# Patient Record
Sex: Female | Born: 1950 | Race: White | Hispanic: No | Smoking: Never smoker
Health system: Southern US, Community
[De-identification: ages and names within clinical notes are randomized; demographics above are authoritative.]

## PROBLEM LIST (undated history)

## (undated) DIAGNOSIS — G43909 Migraine, unspecified, not intractable, without status migrainosus: Secondary | ICD-10-CM

## (undated) HISTORY — PX: ABDOMINAL HYSTERECTOMY: SHX81

## (undated) HISTORY — PX: CYST REMOVAL NECK: SHX6281

## (undated) HISTORY — PX: TONSILLECTOMY: SUR1361

## (undated) HISTORY — PX: EYE SURGERY: SHX253

---

## 2004-01-07 ENCOUNTER — Other Ambulatory Visit: Admission: RE | Admit: 2004-01-07 | Discharge: 2004-01-07 | Payer: Self-pay | Admitting: Obstetrics and Gynecology

## 2008-05-26 ENCOUNTER — Emergency Department (HOSPITAL_BASED_OUTPATIENT_CLINIC_OR_DEPARTMENT_OTHER): Admission: EM | Admit: 2008-05-26 | Discharge: 2008-05-26 | Payer: Self-pay | Admitting: Emergency Medicine

## 2010-06-29 LAB — GLUCOSE, CAPILLARY: Glucose-Capillary: 105 mg/dL — ABNORMAL HIGH (ref 70–99)

## 2011-07-09 ENCOUNTER — Other Ambulatory Visit: Payer: Self-pay | Admitting: Nurse Practitioner

## 2011-07-09 ENCOUNTER — Other Ambulatory Visit: Payer: Self-pay | Admitting: Obstetrics and Gynecology

## 2011-07-09 DIAGNOSIS — N63 Unspecified lump in unspecified breast: Secondary | ICD-10-CM

## 2011-07-12 ENCOUNTER — Other Ambulatory Visit: Payer: Self-pay

## 2011-07-16 ENCOUNTER — Ambulatory Visit
Admission: RE | Admit: 2011-07-16 | Discharge: 2011-07-16 | Disposition: A | Discharge status: Home / Self Care | Payer: PRIVATE HEALTH INSURANCE | Source: Ambulatory Visit | Attending: Obstetrics and Gynecology | Admitting: Obstetrics and Gynecology

## 2011-07-16 ENCOUNTER — Other Ambulatory Visit: Payer: Self-pay | Admitting: Obstetrics and Gynecology

## 2011-07-16 DIAGNOSIS — N63 Unspecified lump in unspecified breast: Secondary | ICD-10-CM

## 2013-07-02 ENCOUNTER — Encounter (HOSPITAL_COMMUNITY): Payer: Self-pay

## 2013-07-02 ENCOUNTER — Encounter (HOSPITAL_COMMUNITY)
Admission: RE | Admit: 2013-07-02 | Discharge: 2013-07-02 | Disposition: A | Discharge status: Home / Self Care | Payer: PRIVATE HEALTH INSURANCE | Source: Ambulatory Visit | Attending: Orthopedic Surgery | Admitting: Orthopedic Surgery

## 2013-07-02 ENCOUNTER — Ambulatory Visit (HOSPITAL_COMMUNITY)
Admission: RE | Admit: 2013-07-02 | Discharge: 2013-07-02 | Disposition: A | Discharge status: Home / Self Care | Payer: PRIVATE HEALTH INSURANCE | Source: Ambulatory Visit | Attending: Orthopedic Surgery | Admitting: Orthopedic Surgery

## 2013-07-02 DIAGNOSIS — Z01812 Encounter for preprocedural laboratory examination: Secondary | ICD-10-CM | POA: Insufficient documentation

## 2013-07-02 HISTORY — DX: Migraine, unspecified, not intractable, without status migrainosus: G43.909

## 2013-07-02 LAB — CBC
HEMATOCRIT: 38.2 % (ref 36.0–46.0)
HEMOGLOBIN: 12.8 g/dL (ref 12.0–15.0)
MCH: 28.6 pg (ref 26.0–34.0)
MCHC: 33.5 g/dL (ref 30.0–36.0)
MCV: 85.3 fL (ref 78.0–100.0)
Platelets: 233 10*3/uL (ref 150–400)
RBC: 4.48 MIL/uL (ref 3.87–5.11)
RDW: 13.5 % (ref 11.5–15.5)
WBC: 6.8 10*3/uL (ref 4.0–10.5)

## 2013-07-02 LAB — BASIC METABOLIC PANEL
BUN: 12 mg/dL (ref 6–23)
CHLORIDE: 100 meq/L (ref 96–112)
CO2: 29 meq/L (ref 19–32)
Calcium: 9.3 mg/dL (ref 8.4–10.5)
Creatinine, Ser: 0.68 mg/dL (ref 0.50–1.10)
GFR calc non Af Amer: >90 mL/min (ref >90)
Glucose, Bld: 92 mg/dL (ref 70–99)
POTASSIUM: 3.9 meq/L (ref 3.7–5.3)
Sodium: 142 mEq/L (ref 137–147)

## 2013-07-02 LAB — SURGICAL PCR SCREEN
MRSA, PCR: NEGATIVE
Staphylococcus aureus: POSITIVE — AB

## 2013-07-02 LAB — TYPE AND SCREEN
ABO/RH(D): O POS
ANTIBODY SCREEN: NEGATIVE

## 2013-07-02 LAB — ABO/RH: ABO/RH(D): O POS

## 2013-07-02 NOTE — Progress Notes (Signed)
Primary physician =- dr. Dareen PianoAnderson [place] No cardiologist No prior cardiac testing

## 2013-07-02 NOTE — Progress Notes (Signed)
Left pt message on voicemail of positive PCR of Staph. Called in rx for Mupirocin 2% ointment to [facility] on [address] [place].

## 2013-07-02 NOTE — Pre-Procedure Instructions (Addendum)
Christy ShelterCarol M GEXBMWUXLosendale  07/02/2013   Your procedure is scheduled on:  Thursday, April 23rd  Report to Admitting at 0530 AM.  Call this number if you have problems the morning of surgery: [phone]   Remember:   Do not eat food or drink liquids after midnight.   Take these medicines the morning of surgery with A SIP OF WATER: none   Do not wear jewelry, make-up or nail polish.  Do not wear lotions, powders, or perfumes. You may wear deodorant.  Do not shave 48 hours prior to surgery. Men may shave face and neck.  Do not bring valuables to the hospital.  [facility] is not responsible for any belongings or valuables.               Contacts, dentures or bridgework may not be worn into surgery.  Leave suitcase in the car. After surgery it may be brought to your room.  For patients admitted to the hospital, discharge time is determined by your treatment team.               Patients discharged the day of surgery will not be allowed to drive home.  Please read over the following fact sheets that you were given: Pain Booklet, Coughing and Deep Breathing, Blood Transfusion Information, MRSA Information and Surgical Site Infection Prevention [place] - Preparing for Surgery  Before surgery, you can play an important role.  Because skin is not sterile, your skin needs to be as free of germs as possible.  You can reduce the number of germs on you skin by washing with CHG (chlorahexidine gluconate) soap before surgery.  CHG is an antiseptic cleaner which kills germs and bonds with the skin to continue killing germs even after washing.  Please DO NOT use if you have an allergy to CHG or antibacterial soaps.  If your skin becomes reddened/irritated stop using the CHG and inform your nurse when you arrive at Short Stay.  Do not shave (including legs and underarms) for at least 48 hours prior to the first CHG shower.  You may shave your face.  Please follow these instructions carefully:   1.  Shower  with CHG Soap the night before surgery and the morning of Surgery.  2.  If you choose to wash your hair, wash your hair first as usual with your normal shampoo.  3.  After you shampoo, rinse your hair and body thoroughly to remove the shampoo.  4.  Use CHG as you would any other liquid soap.  You can apply CHG directly to the skin and wash gently with scrungie or a clean washcloth.  5.  Apply the CHG Soap to your body ONLY FROM THE NECK DOWN.  Do not use on open wounds or open sores.  Avoid contact with your eyes, ears, mouth and genitals (private parts).  Wash genitals (private parts) with your normal soap.  6.  Wash thoroughly, paying special attention to the area where your surgery will be performed.  7.  Thoroughly rinse your body with warm water from the neck down.  8.  DO NOT shower/wash with your normal soap after using and rinsing off the CHG Soap.  9.  Pat yourself dry with a clean towel.            10.  Wear clean pajamas.            11.  Place clean sheets on your bed the night of your first shower and  do not sleep with pets.  Day of Surgery  Do not apply any lotions/deoderants the morning of surgery.  Please wear clean clothes to the hospital/surgery center.

## 2013-07-08 MED ORDER — DEXAMETHASONE SODIUM PHOSPHATE 4 MG/ML IJ SOLN
4.0000 mg | Freq: Once | INTRAMUSCULAR | Status: AC
  Administered 2013-07-09: 10 mg via INTRAVENOUS
  Filled 2013-07-08: qty 1

## 2013-07-08 MED ORDER — CEFAZOLIN SODIUM-DEXTROSE 2-3 GM-% IV SOLR
2.0000 g | INTRAVENOUS | Status: AC
  Administered 2013-07-09 (×2): 2 g via INTRAVENOUS
  Filled 2013-07-08: qty 50

## 2013-07-08 MED ORDER — ACETAMINOPHEN 10 MG/ML IV SOLN
1000.0000 mg | Freq: Four times a day (QID) | INTRAVENOUS | Status: DC
  Administered 2013-07-09: 1000 mg via INTRAVENOUS
  Filled 2013-07-08: qty 100

## 2013-07-09 ENCOUNTER — Inpatient Hospital Stay (HOSPITAL_COMMUNITY): Payer: PRIVATE HEALTH INSURANCE

## 2013-07-09 ENCOUNTER — Inpatient Hospital Stay (HOSPITAL_COMMUNITY)
Admission: RE | Admit: 2013-07-09 | Discharge: 2013-07-11 | DRG: 460 | Disposition: A | Discharge status: Home Health | Payer: PRIVATE HEALTH INSURANCE | Source: Ambulatory Visit | Attending: Orthopedic Surgery | Admitting: Orthopedic Surgery

## 2013-07-09 ENCOUNTER — Encounter (HOSPITAL_COMMUNITY): Payer: Self-pay | Admitting: *Deleted

## 2013-07-09 ENCOUNTER — Encounter (HOSPITAL_COMMUNITY)
Admission: RE | Disposition: A | Discharge status: Home Health | Payer: Self-pay | Source: Ambulatory Visit | Attending: Orthopedic Surgery

## 2013-07-09 ENCOUNTER — Encounter (HOSPITAL_COMMUNITY): Payer: PRIVATE HEALTH INSURANCE | Admitting: Anesthesiology

## 2013-07-09 ENCOUNTER — Inpatient Hospital Stay (HOSPITAL_COMMUNITY): Payer: PRIVATE HEALTH INSURANCE | Admitting: Anesthesiology

## 2013-07-09 DIAGNOSIS — M549 Dorsalgia, unspecified: Secondary | ICD-10-CM | POA: Diagnosis present

## 2013-07-09 DIAGNOSIS — M48061 Spinal stenosis, lumbar region without neurogenic claudication: Principal | ICD-10-CM | POA: Diagnosis present

## 2013-07-09 DIAGNOSIS — Z981 Arthrodesis status: Secondary | ICD-10-CM

## 2013-07-09 DIAGNOSIS — Z833 Family history of diabetes mellitus: Secondary | ICD-10-CM

## 2013-07-09 DIAGNOSIS — Q762 Congenital spondylolisthesis: Secondary | ICD-10-CM

## 2013-07-09 SURGERY — POSTERIOR LUMBAR FUSION 1 LEVEL
Anesthesia: General | Site: Spine Lumbar

## 2013-07-09 MED ORDER — METHOCARBAMOL 500 MG PO TABS
500.0000 mg | ORAL_TABLET | Freq: Four times a day (QID) | ORAL | Status: DC | PRN
  Administered 2013-07-09: 500 mg via ORAL

## 2013-07-09 MED ORDER — METHOCARBAMOL 500 MG PO TABS
ORAL_TABLET | ORAL | Status: AC
  Filled 2013-07-09: qty 1

## 2013-07-09 MED ORDER — SODIUM CHLORIDE 0.9 % IJ SOLN
INTRAMUSCULAR | Status: AC
  Filled 2013-07-09: qty 10

## 2013-07-09 MED ORDER — DIPHENHYDRAMINE HCL 50 MG/ML IJ SOLN: 12.5000 mg | Freq: Four times a day (QID) | INTRAMUSCULAR | Status: DC | PRN

## 2013-07-09 MED ORDER — MIDAZOLAM HCL 5 MG/5ML IJ SOLN
INTRAMUSCULAR | Status: DC | PRN
  Administered 2013-07-09: 2 mg via INTRAVENOUS

## 2013-07-09 MED ORDER — MIDAZOLAM HCL 2 MG/2ML IJ SOLN
INTRAMUSCULAR | Status: AC
  Filled 2013-07-09: qty 2

## 2013-07-09 MED ORDER — ACETAMINOPHEN 10 MG/ML IV SOLN
1000.0000 mg | Freq: Four times a day (QID) | INTRAVENOUS | Status: AC
  Administered 2013-07-09 – 2013-07-10 (×4): 1000 mg via INTRAVENOUS
  Filled 2013-07-09 (×4): qty 100

## 2013-07-09 MED ORDER — OXYCODONE HCL 5 MG PO TABS
5.0000 mg | ORAL_TABLET | Freq: Once | ORAL | Status: AC | PRN
  Administered 2013-07-09: 5 mg via ORAL

## 2013-07-09 MED ORDER — OXYCODONE HCL 5 MG PO TABS
ORAL_TABLET | ORAL | Status: AC
  Filled 2013-07-09: qty 1

## 2013-07-09 MED ORDER — OXYCODONE HCL 5 MG PO TABS
10.0000 mg | ORAL_TABLET | ORAL | Status: DC | PRN
  Administered 2013-07-11: 10 mg via ORAL
  Filled 2013-07-09: qty 2

## 2013-07-09 MED ORDER — ROCURONIUM BROMIDE 50 MG/5ML IV SOLN
INTRAVENOUS | Status: AC
  Filled 2013-07-09: qty 1

## 2013-07-09 MED ORDER — DEXAMETHASONE 4 MG PO TABS
4.0000 mg | ORAL_TABLET | Freq: Four times a day (QID) | ORAL | Status: DC
  Administered 2013-07-09 – 2013-07-11 (×7): 4 mg via ORAL
  Filled 2013-07-09 (×11): qty 1

## 2013-07-09 MED ORDER — MENTHOL 3 MG MT LOZG: 1.0000 | LOZENGE | OROMUCOSAL | Status: DC | PRN

## 2013-07-09 MED ORDER — PROPOFOL 10 MG/ML IV BOLUS
INTRAVENOUS | Status: AC
  Filled 2013-07-09: qty 20

## 2013-07-09 MED ORDER — ONDANSETRON HCL 4 MG/2ML IJ SOLN: 4.0000 mg | Freq: Four times a day (QID) | INTRAMUSCULAR | Status: DC | PRN

## 2013-07-09 MED ORDER — DEXAMETHASONE SODIUM PHOSPHATE 10 MG/ML IJ SOLN
INTRAMUSCULAR | Status: AC
  Filled 2013-07-09: qty 1

## 2013-07-09 MED ORDER — EPHEDRINE SULFATE 50 MG/ML IJ SOLN
INTRAMUSCULAR | Status: DC | PRN
  Administered 2013-07-09 (×2): 5 mg via INTRAVENOUS

## 2013-07-09 MED ORDER — ALBUMIN HUMAN 5 % IV SOLN
INTRAVENOUS | Status: DC | PRN
  Administered 2013-07-09: 11:00:00 via INTRAVENOUS

## 2013-07-09 MED ORDER — FENTANYL CITRATE 0.05 MG/ML IJ SOLN
INTRAMUSCULAR | Status: DC | PRN
  Administered 2013-07-09: 250 ug via INTRAVENOUS
  Administered 2013-07-09 (×2): 50 ug via INTRAVENOUS

## 2013-07-09 MED ORDER — LIDOCAINE HCL (CARDIAC) 20 MG/ML IV SOLN
INTRAVENOUS | Status: DC | PRN
  Administered 2013-07-09: 30 mg via INTRAVENOUS

## 2013-07-09 MED ORDER — ONDANSETRON HCL 4 MG/2ML IJ SOLN
INTRAMUSCULAR | Status: AC
  Filled 2013-07-09: qty 2

## 2013-07-09 MED ORDER — PROPOFOL 10 MG/ML IV BOLUS
INTRAVENOUS | Status: DC | PRN
  Administered 2013-07-09: 100 mg via INTRAVENOUS

## 2013-07-09 MED ORDER — CEFAZOLIN SODIUM 1-5 GM-% IV SOLN
1.0000 g | Freq: Three times a day (TID) | INTRAVENOUS | Status: AC
  Administered 2013-07-09 – 2013-07-10 (×2): 1 g via INTRAVENOUS
  Filled 2013-07-09 (×3): qty 50

## 2013-07-09 MED ORDER — ONDANSETRON HCL 4 MG/2ML IJ SOLN
4.0000 mg | INTRAMUSCULAR | Status: DC | PRN
  Administered 2013-07-09: 4 mg via INTRAVENOUS
  Filled 2013-07-09: qty 2

## 2013-07-09 MED ORDER — SODIUM CHLORIDE 0.9 % IJ SOLN: 9.0000 mL | INTRAMUSCULAR | Status: DC | PRN

## 2013-07-09 MED ORDER — LACTATED RINGERS IV SOLN
INTRAVENOUS | Status: DC | PRN
  Administered 2013-07-09 (×3): via INTRAVENOUS

## 2013-07-09 MED ORDER — CEFAZOLIN SODIUM-DEXTROSE 2-3 GM-% IV SOLR
INTRAVENOUS | Status: AC
  Filled 2013-07-09: qty 50

## 2013-07-09 MED ORDER — OXYCODONE HCL 5 MG/5ML PO SOLN: 5.0000 mg | Freq: Once | ORAL | Status: AC | PRN

## 2013-07-09 MED ORDER — PHENOL 1.4 % MT LIQD: 1.0000 | OROMUCOSAL | Status: DC | PRN

## 2013-07-09 MED ORDER — MORPHINE SULFATE (PF) 1 MG/ML IV SOLN
INTRAVENOUS | Status: DC
  Administered 2013-07-09: 2 mg via INTRAVENOUS
  Administered 2013-07-09: 13:00:00 via INTRAVENOUS
  Administered 2013-07-10: 1 mg via INTRAVENOUS
  Administered 2013-07-10: 9 mg via INTRAVENOUS
  Administered 2013-07-10: 2 mg via INTRAVENOUS

## 2013-07-09 MED ORDER — SODIUM CHLORIDE 0.9 % IJ SOLN
3.0000 mL | Freq: Two times a day (BID) | INTRAMUSCULAR | Status: DC
  Administered 2013-07-10 (×2): 3 mL via INTRAVENOUS

## 2013-07-09 MED ORDER — BUPIVACAINE-EPINEPHRINE (PF) 0.25% -1:200000 IJ SOLN
INTRAMUSCULAR | Status: AC
  Filled 2013-07-09: qty 30

## 2013-07-09 MED ORDER — HYDROMORPHONE HCL PF 1 MG/ML IJ SOLN
INTRAMUSCULAR | Status: AC
  Filled 2013-07-09: qty 1

## 2013-07-09 MED ORDER — PHENYLEPHRINE HCL 10 MG/ML IJ SOLN
INTRAMUSCULAR | Status: DC | PRN
  Administered 2013-07-09 (×4): 40 ug via INTRAVENOUS

## 2013-07-09 MED ORDER — NALOXONE HCL 0.4 MG/ML IJ SOLN: 0.4000 mg | INTRAMUSCULAR | Status: DC | PRN

## 2013-07-09 MED ORDER — SODIUM CHLORIDE 0.9 % IV SOLN: 250.0000 mL | INTRAVENOUS | Status: DC

## 2013-07-09 MED ORDER — LACTATED RINGERS IV SOLN
INTRAVENOUS | Status: DC
  Administered 2013-07-09 (×2): via INTRAVENOUS

## 2013-07-09 MED ORDER — THROMBIN 20000 UNITS EX SOLR
CUTANEOUS | Status: DC | PRN
  Administered 2013-07-09: 09:00:00 via TOPICAL

## 2013-07-09 MED ORDER — BUPIVACAINE-EPINEPHRINE 0.25% -1:200000 IJ SOLN
INTRAMUSCULAR | Status: DC | PRN
  Administered 2013-07-09: 10 mL

## 2013-07-09 MED ORDER — SODIUM CHLORIDE 0.9 % IJ SOLN: 3.0000 mL | INTRAMUSCULAR | Status: DC | PRN

## 2013-07-09 MED ORDER — HYDROMORPHONE HCL PF 1 MG/ML IJ SOLN
0.2500 mg | INTRAMUSCULAR | Status: DC | PRN
  Administered 2013-07-09: 0.5 mg via INTRAVENOUS
  Administered 2013-07-09 (×2): 0.25 mg via INTRAVENOUS
  Administered 2013-07-09: 0.5 mg via INTRAVENOUS

## 2013-07-09 MED ORDER — ONDANSETRON HCL 4 MG/2ML IJ SOLN
INTRAMUSCULAR | Status: DC | PRN
  Administered 2013-07-09: 4 mg via INTRAVENOUS

## 2013-07-09 MED ORDER — THROMBIN 20000 UNITS EX SOLR
CUTANEOUS | Status: AC
  Filled 2013-07-09: qty 20000

## 2013-07-09 MED ORDER — 0.9 % SODIUM CHLORIDE (POUR BTL) OPTIME
TOPICAL | Status: DC | PRN
  Administered 2013-07-09: 1000 mL

## 2013-07-09 MED ORDER — DEXAMETHASONE SODIUM PHOSPHATE 4 MG/ML IJ SOLN
4.0000 mg | Freq: Four times a day (QID) | INTRAMUSCULAR | Status: DC
  Administered 2013-07-09: 4 mg via INTRAVENOUS
  Filled 2013-07-09 (×11): qty 1

## 2013-07-09 MED ORDER — SUCCINYLCHOLINE CHLORIDE 20 MG/ML IJ SOLN
INTRAMUSCULAR | Status: DC | PRN
  Administered 2013-07-09: 100 mg via INTRAVENOUS

## 2013-07-09 MED ORDER — EPHEDRINE SULFATE 50 MG/ML IJ SOLN
INTRAMUSCULAR | Status: AC
  Filled 2013-07-09: qty 1

## 2013-07-09 MED ORDER — ARTIFICIAL TEARS OP OINT
TOPICAL_OINTMENT | OPHTHALMIC | Status: DC | PRN
  Administered 2013-07-09: 1 via OPHTHALMIC

## 2013-07-09 MED ORDER — LIDOCAINE HCL (CARDIAC) 20 MG/ML IV SOLN
INTRAVENOUS | Status: AC
  Filled 2013-07-09: qty 5

## 2013-07-09 MED ORDER — DIPHENHYDRAMINE HCL 12.5 MG/5ML PO ELIX: 12.5000 mg | ORAL_SOLUTION | Freq: Four times a day (QID) | ORAL | Status: DC | PRN

## 2013-07-09 MED ORDER — FENTANYL CITRATE 0.05 MG/ML IJ SOLN
INTRAMUSCULAR | Status: AC
  Filled 2013-07-09: qty 5

## 2013-07-09 MED ORDER — MORPHINE SULFATE (PF) 1 MG/ML IV SOLN
INTRAVENOUS | Status: AC
  Filled 2013-07-09: qty 25

## 2013-07-09 MED ORDER — HEMOSTATIC AGENTS (NO CHARGE) OPTIME
TOPICAL | Status: DC | PRN
  Administered 2013-07-09: 1 via TOPICAL

## 2013-07-09 MED ORDER — KETOTIFEN FUMARATE 0.025 % OP SOLN
1.0000 [drp] | Freq: Two times a day (BID) | OPHTHALMIC | Status: DC | PRN
  Filled 2013-07-09: qty 5

## 2013-07-09 MED ORDER — METHOCARBAMOL 100 MG/ML IJ SOLN
500.0000 mg | Freq: Four times a day (QID) | INTRAVENOUS | Status: DC | PRN
  Filled 2013-07-09: qty 5

## 2013-07-09 MED ORDER — ARTIFICIAL TEARS OP OINT
TOPICAL_OINTMENT | OPHTHALMIC | Status: AC
  Filled 2013-07-09: qty 3.5

## 2013-07-09 SURGICAL SUPPLY — 77 items
BLADE SURG ROTATE 9660 (MISCELLANEOUS) IMPLANT
BUR EGG ELITE 4.0 (BURR) ×2 IMPLANT
BUR EGG ELITE 4.0MM (BURR) ×1
CLIP NEUROVISION LG (CLIP) ×3 IMPLANT
CLOSURE STERI-STRIP 1/2X4 (GAUZE/BANDAGES/DRESSINGS) ×1
CLSR STERI-STRIP ANTIMIC 1/2X4 (GAUZE/BANDAGES/DRESSINGS) ×2 IMPLANT
CONT SPEC 4OZ CLIKSEAL STRL BL (MISCELLANEOUS) ×3 IMPLANT
CORDS BIPOLAR (ELECTRODE) ×3 IMPLANT
COVER MAYO STAND STRL (DRAPES) ×6 IMPLANT
COVER SURGICAL LIGHT HANDLE (MISCELLANEOUS) ×3 IMPLANT
DRAIN CHANNEL 15F RND FF W/TCR (WOUND CARE) ×3 IMPLANT
DRAPE C-ARM 42X72 X-RAY (DRAPES) ×3 IMPLANT
DRAPE C-ARMOR (DRAPES) ×3 IMPLANT
DRAPE ORTHO SPLIT 77X108 STRL (DRAPES) ×2
DRAPE POUCH INSTRU U-SHP 10X18 (DRAPES) ×3 IMPLANT
DRAPE SURG 17X23 STRL (DRAPES) ×3 IMPLANT
DRAPE SURG ORHT 6 SPLT 77X108 (DRAPES) ×1 IMPLANT
DRAPE U-SHAPE 47X51 STRL (DRAPES) ×3 IMPLANT
DRSG MEPILEX BORDER 4X4 (GAUZE/BANDAGES/DRESSINGS) ×3 IMPLANT
DRSG MEPILEX BORDER 4X8 (GAUZE/BANDAGES/DRESSINGS) ×3 IMPLANT
DURAPREP 26ML APPLICATOR (WOUND CARE) ×3 IMPLANT
ELECT BLADE 4.0 EZ CLEAN MEGAD (MISCELLANEOUS) ×3
ELECT BLADE 6.5 EXT (BLADE) IMPLANT
ELECT REM PT RETURN 9FT ADLT (ELECTROSURGICAL) ×3
ELECTRODE BLDE 4.0 EZ CLN MEGD (MISCELLANEOUS) ×1 IMPLANT
ELECTRODE REM PT RTRN 9FT ADLT (ELECTROSURGICAL) ×1 IMPLANT
EVACUATOR SILICONE 100CC (DRAIN) ×3 IMPLANT
GLOVE BIOGEL PI IND STRL 8 (GLOVE) ×1 IMPLANT
GLOVE BIOGEL PI IND STRL 8.5 (GLOVE) ×1 IMPLANT
GLOVE BIOGEL PI INDICATOR 8 (GLOVE) ×2
GLOVE BIOGEL PI INDICATOR 8.5 (GLOVE) ×2
GLOVE ECLIPSE 8.5 STRL (GLOVE) ×3 IMPLANT
GLOVE ORTHO TXT STRL SZ7.5 (GLOVE) ×3 IMPLANT
GOWN STRL REUS W/ TWL LRG LVL3 (GOWN DISPOSABLE) ×1 IMPLANT
GOWN STRL REUS W/TWL 2XL LVL3 (GOWN DISPOSABLE) ×6 IMPLANT
GOWN STRL REUS W/TWL LRG LVL3 (GOWN DISPOSABLE) ×2
GUIDEWIRE NITINOL BEVEL TIP (WIRE) ×12 IMPLANT
KIT BASIN OR (CUSTOM PROCEDURE TRAY) ×3 IMPLANT
KIT NEEDLE NVM5 EMG ELECT (KITS) ×1 IMPLANT
KIT NEEDLE NVM5 EMG ELECTRODE (KITS) ×2
KIT POSITION SURG JACKSON T1 (MISCELLANEOUS) ×3 IMPLANT
KIT ROOM TURNOVER OR (KITS) ×3 IMPLANT
LIGHT SOURCE ANGLE TIP STR 7FT (MISCELLANEOUS) ×3 IMPLANT
MAS TLIF HOOP SHIM (KITS) ×3 IMPLANT
NEEDLE 22X1 1/2 (OR ONLY) (NEEDLE) ×3 IMPLANT
NEEDLE SPNL 18GX3.5 QUINCKE PK (NEEDLE) ×6 IMPLANT
NS IRRIG 1000ML POUR BTL (IV SOLUTION) ×3 IMPLANT
PACK LAMINECTOMY ORTHO (CUSTOM PROCEDURE TRAY) ×3 IMPLANT
PACK UNIVERSAL I (CUSTOM PROCEDURE TRAY) ×3 IMPLANT
PAD ARMBOARD 7.5X6 YLW CONV (MISCELLANEOUS) ×6 IMPLANT
PATTIES SURGICAL .5 X.5 (GAUZE/BANDAGES/DRESSINGS) IMPLANT
PATTIES SURGICAL .5 X1 (DISPOSABLE) ×3 IMPLANT
PRECEPT SHANK CANN 6.5X40 (Neuro Prosthesis/Implant) ×12 IMPLANT
PRECEPT TULIPS (Neuro Prosthesis/Implant) ×12 IMPLANT
PROBE BALL TIP NVM5 SNG USE (BALLOONS) ×3 IMPLANT
ROD 50MM (Rod) ×6 IMPLANT
SCREW PRECEPT SET (Screw) ×12 IMPLANT
SHEET CONFORM 45LX20WX7H (Bone Implant) ×3 IMPLANT
SPONGE GAUZE 4X4 12PLY STER LF (GAUZE/BANDAGES/DRESSINGS) ×3 IMPLANT
SPONGE LAP 4X18 X RAY DECT (DISPOSABLE) ×6 IMPLANT
SPONGE SURGIFOAM ABS GEL 100 (HEMOSTASIS) ×3 IMPLANT
SPONGE SURGIFOAM ABS GEL SZ50 (HEMOSTASIS) ×3 IMPLANT
SURGIFLO TRUKIT (HEMOSTASIS) ×3 IMPLANT
SUT ETHILON 2 0 FS 18 (SUTURE) ×3 IMPLANT
SUT MON AB 3-0 SH 27 (SUTURE) ×4
SUT MON AB 3-0 SH27 (SUTURE) ×2 IMPLANT
SUT VIC AB 1 CTX 18 (SUTURE) ×6 IMPLANT
SUT VIC AB 2-0 CT1 18 (SUTURE) ×3 IMPLANT
SYR BULB IRRIGATION 50ML (SYRINGE) ×3 IMPLANT
SYR CONTROL 10ML LL (SYRINGE) ×6 IMPLANT
TAPE CLOTH SURG 4X10 WHT LF (GAUZE/BANDAGES/DRESSINGS) ×3 IMPLANT
TOWEL OR 17X24 6PK STRL BLUE (TOWEL DISPOSABLE) IMPLANT
TOWEL OR 17X26 10 PK STRL BLUE (TOWEL DISPOSABLE) ×6 IMPLANT
TRAY FOLEY CATH 14FR (SET/KITS/TRAYS/PACK) ×3 IMPLANT
TRAY FOLEY CATH 16FRSI W/METER (SET/KITS/TRAYS/PACK) IMPLANT
WATER STERILE IRR 1000ML POUR (IV SOLUTION) IMPLANT
YANKAUER SUCT BULB TIP NO VENT (SUCTIONS) ×3 IMPLANT

## 2013-07-09 NOTE — Brief Op Note (Signed)
07/09/2013  11:19 AM  PATIENT:  [name]  63 y.o. female  PRE-OPERATIVE DIAGNOSIS:  GRADE 3 SLIP L5-S1  POST-OPERATIVE DIAGNOSIS:  GRADE 3 SLIP L5-S1  PROCEDURE:  Procedure(s): POSTERIOR DECOMPRESSION L5-S1 POSTERIOR FUSION L5-S1 POSSIBLE L4-S1 AND POSSIBLE L4-5 INTERBODY FUSION (N/A)  SURGEON:  Surgeon(s) and Role:    * Venita Lickahari Isayah Ignasiak, MD - Primary  PHYSICIAN ASSISTANT:   ASSISTANTS: Zonia KiefJames Owens   ANESTHESIA:   general  EBL:  Total I/O In: 1750 [I.V.:1500; IV Piggyback:250] Out: 700 [Urine:500; Blood:200]  BLOOD ADMINISTERED:none  DRAINS: JP in the back  LOCAL MEDICATIONS USED:  MARCAINE     SPECIMEN:  No Specimen  DISPOSITION OF SPECIMEN:  N/A  COUNTS:  YES  TOURNIQUET:  * No tourniquets in log *  DICTATION: .Other Dictation: Dictation Number [id number]  PLAN OF CARE: Admit to inpatient   PATIENT DISPOSITION:  PACU - hemodynamically stable.

## 2013-07-09 NOTE — Anesthesia Procedure Notes (Signed)
Procedure Name: Intubation Date/Time: 07/09/2013 8:26 AM Performed by: Lovie CholOCK, Latiya Navia K Pre-anesthesia Checklist: Patient identified, Emergency Drugs available, Suction available, Patient being monitored and Timeout performed Patient Re-evaluated:Patient Re-evaluated prior to inductionOxygen Delivery Method: Circle system utilized Preoxygenation: Pre-oxygenation with 100% oxygen Intubation Type: IV induction Ventilation: Mask ventilation without difficulty and Oral airway inserted - appropriate to patient size Laryngoscope Size: Miller and 2 Grade View: Grade II Tube type: Oral Tube size: 7.0 mm Number of attempts: 1 Airway Equipment and Method: Stylet Placement Confirmation: ETT inserted through vocal cords under direct vision,  positive ETCO2,  CO2 detector and breath sounds checked- equal and bilateral Secured at: 21 cm Tube secured with: Tape Dental Injury: Teeth and Oropharynx as per pre-operative assessment

## 2013-07-09 NOTE — Progress Notes (Signed)
Utilization review completed.  

## 2013-07-09 NOTE — Transfer of Care (Signed)
Immediate Anesthesia Transfer of Care Note  Patient: [name]  Procedure(s) Performed: Procedure(s): POSTERIOR DECOMPRESSION L4-S1 POSTERIOR FUSION L4-S1 WITH CROSSLINK (N/A)  Patient Location: PACU  Anesthesia Type:General  Level of Consciousness: awake, oriented and patient cooperative  Airway & Oxygen Therapy: Patient Spontanous Breathing and Patient connected to nasal cannula oxygen  Post-op Assessment: Report given to PACU RN and Post -op Vital signs reviewed and stable  Post vital signs: Reviewed  Complications: No apparent anesthesia complications

## 2013-07-09 NOTE — Anesthesia Postprocedure Evaluation (Signed)
  Anesthesia Post-op Note  Patient: Christy ShelterCarol M Abdulla  Procedure(s) Performed: Procedure(s): POSTERIOR DECOMPRESSION L4-S1 POSTERIOR FUSION L4-S1 WITH CROSSLINK (N/A)  Patient Location: PACU  Anesthesia Type:General  Level of Consciousness: awake and alert   Airway and Oxygen Therapy: Patient Spontanous Breathing  Post-op Pain: mild  Post-op Assessment: Post-op Vital signs reviewed, Patient's Cardiovascular Status Stable and Respiratory Function Stable  Post-op Vital Signs: Reviewed  Filed Vitals:   07/09/13 1315  BP: 113/62  Pulse: 70  Temp: 36.4 C  Resp: 13    Complications: No apparent anesthesia complications

## 2013-07-09 NOTE — Anesthesia Preprocedure Evaluation (Addendum)
Anesthesia Evaluation  Patient identified by MRN, date of birth, ID band Patient awake    Reviewed: Allergy & Precautions, H&P , NPO status , Patient's Chart, lab work & pertinent test results  History of Anesthesia Complications Negative for: history of anesthetic complications  Airway Mallampati: II TM Distance: >3 FB Neck ROM: Full    Dental no notable dental hx. (+) Teeth Intact, Dental Advisory Given   Pulmonary neg pulmonary ROS,  breath sounds clear to auscultation  Pulmonary exam normal       Cardiovascular negative cardio ROS  Rhythm:Regular Rate:Normal     Neuro/Psych  Headaches, negative psych ROS   GI/Hepatic negative GI ROS, Neg liver ROS,   Endo/Other  negative endocrine ROS  Renal/GU negative Renal ROS  negative genitourinary   Musculoskeletal   Abdominal   Peds  Hematology negative hematology ROS (+)   Anesthesia Other Findings   Reproductive/Obstetrics negative OB ROS                         Anesthesia Physical Anesthesia Plan  ASA: II  Anesthesia Plan: General   Post-op Pain Management:    Induction: Intravenous  Airway Management Planned: Oral ETT  Additional Equipment:   Intra-op Plan:   Post-operative Plan: Extubation in OR  Informed Consent: I have reviewed the patients History and Physical, chart, labs and discussed the procedure including the risks, benefits and alternatives for the proposed anesthesia with the patient or authorized representative who has indicated his/her understanding and acceptance.   Dental advisory given  Plan Discussed with: CRNA  Anesthesia Plan Comments:         Anesthesia Quick Evaluation

## 2013-07-09 NOTE — H&P (Signed)
History of Present Illness  The patient is a 63 year old female who comes in today for a preoperative History and Physical. The patient is scheduled for a Posterior decompression L5-S1, Posterior fusion L5-S1, possible L4-5 to be performed by Dr. Debria Garretahari D. Shon BatonBrooks, MD at [facility] 07/09/2013 . Please see the hospital record for complete dictated history and physical.   Back painis described as the following: The patient reports low back (Left side, ) symptoms including pain, burning and tingling (left leg) without any known injury. The patient describes the pain as burning and throbbing. The patient describes the severity of their symptoms as severe. The patient feels as if the symptoms are worsening. Symptoms are relieved by cold packs, heat packs and nonsteroidal anti-inflammatory drugs. Current treatment includes non-opioid analgesics (OTC cream and Ibuprofen). Prior to being seen today the patient was previously evaluated in this clinic. Past treatment has included epidural injections (through Dr.Elsner, ), chiropractic manipulation and Class 4 Laser treatments by Genene ChurnAaron Williams , Chiropractor which did not help.   63 year old white female complains of low back pain and bilateral leg pain. Grade 3 spondylolisthesis at L5-S1 and minimal disc bulge at L4-L5. Returns. She states symptoms are unchanged from previous visit. She wants to proceed with posterior decompression at L5-S1 with instrumented fusion either L4 to S1 or L5 to S1.     Allergies No Known Drug Allergies. 05/15/2013    Family History(Sue M Toomes; 06/30/2013 2:01 PM) Cancer. father and brother Diabetes Mellitus. grandmother mothers side    Social History(Sue M Toomes; 06/30/2013 2:01 PM) Alcohol use. current drinker; drinks wine; only occasionally per week Children. 3 Current work status. working full time Drug/Alcohol Rehab (Currently). no Drug/Alcohol Rehab (Previously).  no Exercise. Exercises weekly; does running / walking Illicit drug use. no Living situation. live alone Marital status. divorced Number of flights of stairs before winded. 2-3 Pain Contract. no Tobacco / smoke exposure. no Tobacco use. never smoker     Medication History No Current Medications. Medications Reconciled.   On clinical exam she is a pleasant woman who appears younger than her stated age. She is alert and oriented times three. No shortness of breath or chest pain. The abdomen is soft and nontender. Compartments are soft and nontender. Intact peripheral pulses bilaterally in the lower extremities. She has no hip, knee or ankle pain with joint range of motion. She has back pain with palpation. Horrific pain with extension and forward flexion. She is grossly neurologically intact with no significant motor deficits. She does have significant numbness and dysesthesias. No incontinence of bowel and bladder.  RADIOGRAPHS:  X rays of the spine taken today, multiple views AP, lateral, flexion and extension show that she does have hyperplastic L5 pedicle. She has lordosis still well maintained. No scoliosis. Significant foraminal compromise. She has bilateral pars defects at L5.  She had an MRI 05/22/13, which demonstrates a grade III spondylolisthesis at L5-S1 with complete collapse of the L5-S1 disc and severe degenerative changes, severe bilateral neuroforaminal narrowing, lateral recess stenosis, and central stenosis. There is significant encroachment. At 4-5 there is minimal disc bulge. Minimal stenosis. No significant findings above.  At this point in time clinically the patient has progressive debilitating back, buttock, and leg pain. She has had appropriate conservative management and yet, unfortunately, she continues to have severe debilitating pain. In order to address this issue she would need an extensive decompression followed by  instrumented fusion to prevent worsening of her slip.  The concern is given the slip angle and the hyperplastic L5 pedicle that fixation at the L5 level may be problematic. Therefore, I would need to extend the instrumentation to L4. If I did extend it to L4 in addition to the pedicle screw fixation, I would proceed with an interbody device. This can be accomplished with TLIF. Given that, we have talked about a posterior decompression L5-S1 with instrumented fusion either L4 to S1 or L5 to S1 with interbody fixation at L4-5.    We have addressed the surgery, we have reviewed the risks and benefits. The risks of that include infection, bleeding, nerve damage, death, stoke, paralysis, failure to heal, need for further surgery, ongoing or worse pain, loss of bowel and bladder control, throat pain, CSF leak, nonunion meaning it does not fuse, blood clots, adjacent segment disease.    Due to the potential of a multilevel procedure, I would request an external bone stimulator.

## 2013-07-10 ENCOUNTER — Inpatient Hospital Stay (HOSPITAL_COMMUNITY): Payer: PRIVATE HEALTH INSURANCE

## 2013-07-10 MED ORDER — ONDANSETRON 4 MG PO TBDP: 4.0000 mg | ORAL_TABLET | Freq: Three times a day (TID) | ORAL | Status: AC | PRN

## 2013-07-10 MED ORDER — ACETAMINOPHEN 500 MG PO TABS
1000.0000 mg | ORAL_TABLET | Freq: Four times a day (QID) | ORAL | Status: DC | PRN
  Administered 2013-07-10 – 2013-07-11 (×3): 1000 mg via ORAL
  Filled 2013-07-10 (×3): qty 2

## 2013-07-10 MED ORDER — METHOCARBAMOL 500 MG PO TABS: 500.0000 mg | ORAL_TABLET | Freq: Four times a day (QID) | ORAL | Status: AC | PRN

## 2013-07-10 MED ORDER — DOCUSATE SODIUM 100 MG PO CAPS: 100.0000 mg | ORAL_CAPSULE | Freq: Two times a day (BID) | ORAL | Status: AC

## 2013-07-10 MED ORDER — OXYCODONE-ACETAMINOPHEN 10-325 MG PO TABS: 1.0000 | ORAL_TABLET | Freq: Four times a day (QID) | ORAL | Status: AC | PRN

## 2013-07-10 MED ORDER — POLYETHYLENE GLYCOL 3350 17 G PO PACK: 17.0000 g | PACK | Freq: Every day | ORAL | Status: AC

## 2013-07-10 MED FILL — Heparin Sodium (Porcine) Inj 1000 Unit/ML: INTRAMUSCULAR | Qty: 30 | Status: AC

## 2013-07-10 MED FILL — Sodium Chloride IV Soln 0.9%: INTRAVENOUS | Qty: 1000 | Status: AC

## 2013-07-10 MED FILL — Sodium Chloride Irrigation Soln 0.9%: Qty: 3000 | Status: AC

## 2013-07-10 NOTE — Plan of Care (Signed)
Problem: Consults Goal: Diagnosis - Spinal Surgery Thoraco/Lumbar Spine Fusion--see op note

## 2013-07-10 NOTE — Care Management Note (Signed)
CARE MANAGEMENT NOTE 07/10/2013  Patient:  Syble CreekROSENDALE,Jakki M   Account Number:  [id number]  Date Initiated:  07/10/2013  Documentation initiated by:  Vance PeperBRADY,Sani Madariaga  Subjective/Objective Assessment:   63 yr old female s/pL5-S1 posterior decompression fusion.     Action/Plan:   Case manager spoke with patient and family concerning home health and DME needs at discharge. Choice offered. [facility], [facility]. Patient is 5'3" will need youth walker.   Anticipated DC Date:  07/11/2013   Anticipated DC Plan:  HOME W HOME HEALTH SERVICES      DC Planning Services  CM consult      [facility]  HOME HEALTH  DURABLE MEDICAL EQUIPMENT   Choice offered to / List presented to:  C-1 Patient   DME arranged  [facility]      DME agency  [facility]     HH arranged  HH-2 PT      [facility]   Status of service:  Completed, signed off Medicare Important Message given?   (If response is "NO", the following Medicare IM given date fields will be blank) Date Medicare IM given:   Date Additional Medicare IM given:    Discharge Disposition:  HOME W HOME HEALTH SERVICES

## 2013-07-10 NOTE — Progress Notes (Signed)
    Subjective: Procedure(s) (LRB): POSTERIOR DECOMPRESSION L4-S1 POSTERIOR FUSION L4-S1 WITH CROSSLINK (N/A) 1 Day Post-Op  Patient reports pain as 2 on 0-10 scale.  Reports decreased leg pain reports incisional back pain   Positive void Negative bowel movement Positive flatus Negative chest pain or shortness of breath  Objective: Vital signs in last 24 hours: Temp:  [97.5 F (36.4 C)-98.1 F (36.7 C)] 97.9 F (36.6 C) (04/24 0456) Pulse Rate:  [67-79] 69 (04/24 0456) Resp:  [12-26] 16 (04/24 0456) BP: (99-120)/(55-90) 103/55 mmHg (04/24 0456) SpO2:  [93 %-100 %] 97 % (04/24 0456)  Intake/Output from previous day: 04/23 0701 - 04/24 0700 In: 5086.4 [P.O.:960; I.V.:3476.4; IV Piggyback:650] Out: 2595 [Urine:2200; Drains:195; Blood:200]  Labs: No results found for this basename: WBC, RBC, HCT, PLT,  in the last 72 hours No results found for this basename: NA, K, CL, CO2, BUN, CREATININE, GLUCOSE, CALCIUM,  in the last 72 hours No results found for this basename: LABPT, INR,  in the last 72 hours  Physical Exam: Neurologically intact ABD soft Neurovascular intact Intact pulses distally Incision: dressing C/D/I Compartment soft 60  - drain output   Assessment/Plan: Patient stable  xrays pending Continue mobilization with physical therapy Continue care  Advance diet Up with therapy D/C IV fluids D/c drain as there is low output Mobilization today Possible d/c Saturday/Sunday   Christy Lickahari Primo Innis, MD [facility] [phone]

## 2013-07-10 NOTE — Evaluation (Signed)
Physical Therapy Evaluation Patient Details Name: Christy CreekCarol M Washer MRN: [id number] DOB: March 11, 1951 Today's Date: 07/10/2013   History of Present Illness  pt presents with L4-S1 PLIF.    Clinical Impression  Pt very motivated and moving well.  Will need to f/u and see if pt will need RW for D/C or amb without UE support.  Will continue to follow.      Follow Up Recommendations Home health PT;Supervision - Intermittent    Equipment Recommendations  Rolling walker with 5" wheels    Recommendations for Other Services       Precautions / Restrictions Precautions Precautions: Back Precaution Booklet Issued: Yes (comment) Required Braces or Orthoses: Spinal Brace Spinal Brace: Lumbar corset;Applied in sitting position Restrictions Weight Bearing Restrictions: No      Mobility  Bed Mobility Overal bed mobility: Needs Assistance Bed Mobility: Rolling;Sidelying to Sit Rolling: Supervision Sidelying to sit: Min guard       General bed mobility comments: cues for log roll technique and encouragement.    Transfers Overall transfer level: Needs assistance Equipment used: None Transfers: Sit to/from Stand Sit to Stand: Min guard         General transfer comment: cues for UE use.    Ambulation/Gait Ambulation/Gait assistance: Min guard Ambulation Distance (Feet): 200 Feet Assistive device:  (IV pole) Gait Pattern/deviations: Step-through pattern;Decreased stride length   Gait velocity interpretation: Below normal speed for age/gender General Gait Details: pt only utilized IV pole for support, but will need to assess next session if pt will need RW or be able to go without UE support.    Stairs            Wheelchair Mobility    Modified Rankin (Stroke Patients Only)       Balance                                             Pertinent Vitals/Pain 4-5/10.  Premedicated.      Home Living Family/patient expects to be discharged to::  Private residence Living Arrangements: Children Available Help at Discharge: Family;Available 24 hours/day Type of Home: House Home Access:  (Only 2" threshold)     Home Layout: One level Home Equipment: None Additional Comments: pt's sister to come stay with her for 2 weeks.      Prior Function Level of Independence: Independent               Hand Dominance        Extremity/Trunk Assessment   Upper Extremity Assessment: Defer to OT evaluation           Lower Extremity Assessment: Overall WFL for tasks assessed      Cervical / Trunk Assessment: Normal  Communication   Communication: No difficulties  Cognition Arousal/Alertness: Awake/alert Behavior During Therapy: WFL for tasks assessed/performed Overall Cognitive Status: Within Functional Limits for tasks assessed                      General Comments      Exercises        Assessment/Plan    PT Assessment Patient needs continued PT services  PT Diagnosis Difficulty walking   PT Problem List Decreased strength;Decreased activity tolerance;Decreased balance;Decreased mobility;Decreased coordination;Decreased knowledge of use of DME;Decreased knowledge of precautions;Pain  PT Treatment Interventions DME instruction;Gait training;Stair training;Functional mobility training;Therapeutic activities;Therapeutic exercise;Balance training;Neuromuscular re-education;Patient/family education  PT Goals (Current goals can be found in the Care Plan section) Acute Rehab PT Goals Patient Stated Goal: Walk without pain.   PT Goal Formulation: With patient Time For Goal Achievement: 07/17/13 Potential to Achieve Goals: Good    Frequency Min 5X/week   Barriers to discharge        Co-evaluation               End of Session Equipment Utilized During Treatment: Gait belt;Back brace Activity Tolerance: Patient tolerated treatment well Patient left: in chair;with call bell/phone within reach Nurse  Communication: Mobility status         Time: 5409-81190753-0818 PT Time Calculation (min): 25 min   Charges:   PT Evaluation $Initial PT Evaluation Tier I: 1 Procedure PT Treatments $Gait Training: 8-22 mins $Therapeutic Activity: 8-22 mins   PT G CodesSunny Schlein:          Helder Crisafulli F Ayen Viviano, [place] CarolinaPT [phone] 07/10/2013, 12:05 PM

## 2013-07-10 NOTE — Evaluation (Signed)
Occupational Therapy Evaluation and Discharge Patient Details Name: Syble CreekCarol M Cullen MRN: [id number] DOB: Jul 25, 1950 Today's Date: 07/10/2013    History of Present Illness pt presents with L4-S1 PLIF.     Clinical Impression   This 63 yo female independent pta admitted for above presents to acute OT with all education completed, we will D/C from acute OT.    Follow Up Recommendations  No OT follow up    Equipment Recommendations  None recommended by OT       Precautions / Restrictions Precautions Precautions: Back Precaution Booklet Issued: Yes (comment) Required Braces or Orthoses: Spinal Brace Spinal Brace: Applied in sitting position;Lumbar corset Restrictions Weight Bearing Restrictions: No      Mobility Bed Mobility Overal bed mobility: Needs Assistance Bed Mobility: Rolling;Sidelying to Sit Rolling: Supervision (for technique) Sidelying to sit: Min guard       General bed mobility comments: cues for log roll technique and encouragement.    Transfers Overall transfer level: Needs assistance Equipment used: Rolling walker (2 wheeled) Transfers: Sit to/from Stand Sit to Stand: Supervision         General transfer comment: VCs for safe hand placement         ADL                                         General ADL Comments: Pt is S for all BADLs including shower stall and toilet transfer.                Pertinent Vitals/Pain 2/10, back, no intervention needed     Hand Dominance Right   Extremity/Trunk Assessment Upper Extremity Assessment Upper Extremity Assessment: Overall WFL for tasks assessed   Lower Extremity Assessment Lower Extremity Assessment: Overall WFL for tasks assessed   Cervical / Trunk Assessment Cervical / Trunk Assessment: Normal   Communication Communication Communication: No difficulties   Cognition Arousal/Alertness: Awake/alert Behavior During Therapy: WFL for tasks assessed/performed Overall  Cognitive Status: Within Functional Limits for tasks assessed                                Home Living Family/patient expects to be discharged to:: Private residence Living Arrangements: Children Available Help at Discharge: Family;Available 24 hours/day Type of Home: House Home Access:  (Only 2" threshold)     Home Layout: One level     Bathroom Shower/Tub: Walk-in shower;Door Shower/tub characteristics: Sport and exercise psychologistDoor Bathroom Toilet: Standard     Home Equipment: Shower seat - built in   Additional Comments: pt's sister to come stay with her for 2 weeks.        Prior Functioning/Environment Level of Independence: Independent                      OT Goals(Current goals can be found in the care plan section) Acute Rehab OT Goals Patient Stated Goal: home tomorrow  OT Frequency:                End of Session Equipment Utilized During Treatment: Back brace;Rolling walker  Activity Tolerance: Patient tolerated treatment well Patient left: in chair;with call bell/phone within reach;with family/visitor present   Time: 4098-11911305-1333 OT Time Calculation (min): 28 min Charges:  OT General Charges $OT Visit: 1 Procedure OT Evaluation $Initial OT Evaluation Tier I: 1 Procedure OT Treatments $Self Care/Home  Management : 8-22 mins  Evette GeorgesCatherine Eva Ulah Olmo [phone] 07/10/2013, 2:37 PM

## 2013-07-10 NOTE — Op Note (Signed)
Christy Frieze:  [name], [name]             ACCOUNT NO.:  [id number]  MEDICAL RECORD NO.:  [id number]  LOCATION:                                 FACILITY:  PHYSICIAN:  Alvy Bealahari D Shanette Tamargo, MD    DATE OF BIRTH:  12/23/1950  DATE OF PROCEDURE:  07/09/2013 DATE OF DISCHARGE:  07/09/2013                              OPERATIVE REPORT   PREOPERATIVE DIAGNOSES:  Grade 3 spondylolisthesis L5-S1.  POSTOPERATIVE DIAGNOSIS:  Grade 3 spondylolisthesis L5-S1.  OPERATIVE PROCEDURE:  Gill decompression L5-S1, posterolateral decompression L4-5, posterolateral segmental instrumentation L4-S1, and posterolateral arthrodesis L4-S1.  COMPLICATIONS:  None.  CONDITION:  Stable.  HISTORY:  This is a very pleasant 63 year old woman who has been having severe progressive back, buttock, and left leg pain, now increasing on the right.  Imaging studies demonstrated a structural abnormality. Despite physical therapy, injection therapy, medications, and activity modification, she continued to deteriorate.  As a result of the failure of conservative management, we elected to proceed with surgery.  Because of the slip, I was concerned that I could not get pedicle screws properly positioned at L5 and S1, and so I consented her for L4-S1 fusion.  Because of the tall L4-5 disk, I was concerned that interbody fixation would have a higher likelihood of pseudoarthrosis and possible cage migration, so I elected not to do the interbody fixation at L4-5. L5-S1, I completely compressed down and there was essentially no interbody space to actually reduce.  OPERATIVE NOTE:  The patient was brought to the operating room, placed supine on the operating table.  After successful induction of general anesthesia, endotracheal intubation, TEDs, SCDs, and Foley were inserted.  Intraoperative needles were placed into the lower extremity for intraoperative EMG neuromonitoring.  The patient was turned prone onto the Wilson frame.  All bony  prominences were well padded.  The back was prepped and draped in a standard fashion.  Time-out was taken to confirm the patient, procedure, and all other pertinent important data. Once that was completed, I then made an incision starting at the superior aspect of the L4 vertebral body and proceeding to the inferior aspect of the S1.  Sharp dissection was carried out down to the deep fascia.  The deep fascia was sharply incised and I stripped the paraspinal muscles to expose the L4, L5, and S1 lamina and facet complexes.  There was significant inflamed fibrocartilaginous material at the L5-S1 level, especially started over the L5 pars defect.  I carefully dissected through this and I noted that the entire posterior element at L5 was globally unstable.  Once I had the exposure, I elected to place my pedicle screws.  Using a Jamshidi needle, visualization and fluoro, I placed the Jamshidi needle in the appropriate starting position, and then using fluoro and neural stim, I advanced the Jamshidi needle through this pedicle and into the vertebral body.  I then placed a guidewire through this.  I repeated this procedure at S1 and on the contralateral side.  Once all 4 pedicle screws were placed, I tapped over each cannula and then probed with a ball-tip sealer to ensure I had solid bony canal.  I then placed 40 mm screws, 6.5 diameter  from [facility] into each of the pedicle holes. Once all 4 screws were placed, I proceeded with the decompression. Because of the anatomical abnormality (spondylolisthesis), I could not place L5 pedicle screws as they would interfere with the S1 screw.  For this reason, then I took the fusion up to the L4 level.  I then used a double-action Leksell rongeur to remove the entire spinous process of L5 and majority of that of L4.  I removed the bulk of the posterior elements with a double-action Leksell rongeur, and then used a fine-nerve curette to develop plane  between the ligamentum flavum and the thecal sac.  I completed my central decompression.  I then went inferiorly and took down the superior portion of the S1 lamina.  I then traced them into the lateral recess until I could palpate the S1 pedicle and then went into the foramen to decompress the S1 nerve root.  Woodson elevator, I could feel them palpate the medial border of the S1 pedicle and there was no breach.  I then continued my dissection superiorly. This has been a significant neural compression.  There had been fibrocartilaginous overgrowth, significant thickening of the ligamentum flavum, and thecal sac compression.  Using a Penfield 4, I gently dissected the plane between the thecal sac in the overlying spur.  I removed this with 2 and 3 mm Kerrison.  Once I had the central 5 decompression complete, I then proceeded into the lateral recess.  I carefully dissected down until I could palpate the pedicle and then I identified the L5 nerve root.  I then used 2 mm Kerrison to remove all of the fibrocartilaginous scar that was in the L5 pars defect and completely unroofed the L5 nerve root.  This was done bilaterally.  Once I could clearly visualize the L5 nerve root beyond the lateral border of the third complex and I adequately decompressed it.  I then continued my dissection superiorly performing an inferior hemilaminotomy of L4.  At this point, my decompression has taken up the proximal inferior aspect of the L4 pedicle.  I had adequate decompression, I could palpate superiorly with my Woodson elevator in the lateral recess and inferiorly as well.  Satisfactorily with the decompression, but most importantly with the L5 nerve root decompression, I used the high-speed burr to decorticate the transverse process of L4, lateral aspect of the 4-5 facet, and the remaining portion of the L5-S1 superior facet.  I then placed bone graft in the posterolateral gutter along with an  ACT allograft sheet to maintain its position.  I then applied the polyaxial heads and then placed 50 mm screws to reduce the slip slightly and locked them into place.  Locking nuts were torqued down according to the manufacture standards.  I then placed a crosslink for added stability. Once all of the screws were locked and torqued down, I irrigated copiously with normal saline, may ensure hemostasis using bipolar electrocautery.  I then placed thrombin-soaked Gelfoam patty over the exposed thecal sac and then placed a deep drain.  I then closed in a layered fashion with interrupted #1 Vicryl sutures, 2-0 Vicryl sutures, and a 3-0 Monocryl.  The patient was then extubated and transferred to the PACU without incident.  At the end of the case, all needle and sponge counts were correct.  There was no adverse intraoperative events. First assistant, Zonia KiefJames Owens, PA, he was instrumental in assisting with retraction, visualization, suction, and wound closure.     Alvy Bealahari D Juandedios Dudash,  MD   ______________________________ Alvy Beal, MD    DDB/MEDQ  D:  07/09/2013  T:  07/10/2013  Job:  [id number]

## 2013-07-11 NOTE — Progress Notes (Signed)
Physical Therapy Treatment Patient Details Name: Christy CreekCarol M [name] MRN: [id number] DOB: 1950/04/16 Today's Date: 07/11/2013    History of Present Illness pt presents with L4-S1 PLIF.      PT Comments    Patient able to practice step. Progressing well and requiring only supervision for safety. Able to recall all precautions and implement with mobility. Anticipate DC home today per mobility goals set on eval.   Follow Up Recommendations  Home health PT;Supervision - Intermittent     Equipment Recommendations  Rolling walker with 5" wheels    Recommendations for Other Services       Precautions / Restrictions Precautions Precautions: Back Required Braces or Orthoses: Spinal Brace Spinal Brace: Applied in sitting position;Lumbar corset    Mobility  Bed Mobility Overal bed mobility: Modified Independent                Transfers Overall transfer level: Modified independent                  Ambulation/Gait Ambulation/Gait assistance: Supervision Ambulation Distance (Feet): 300 Feet Assistive device: Rolling walker (2 wheeled) Gait Pattern/deviations: Step-through pattern     General Gait Details: good safety with use of RW. Cued to relax shoulders   Stairs Stairs: Yes Stairs assistance: Supervision   Number of Stairs: 1 General stair comments: Cues for technique  Wheelchair Mobility    Modified Rankin (Stroke Patients Only)       Balance                                    Cognition Arousal/Alertness: Awake/alert Behavior During Therapy: WFL for tasks assessed/performed Overall Cognitive Status: Within Functional Limits for tasks assessed                      Exercises      General Comments        Pertinent Vitals/Pain Denied pain    Home Living                      Prior Function            PT Goals (current goals can now be found in the care plan section) Progress towards PT goals:  Progressing toward goals    Frequency  Min 5X/week    PT Plan Current plan remains appropriate    Co-evaluation             End of Session Equipment Utilized During Treatment: Gait belt;Back brace Activity Tolerance: Patient tolerated treatment well Patient left: with call bell/phone within reach;in bed     Time: [id number] PT Time Calculation (min): 17 min  Charges:  $Gait Training: 8-22 mins                    G Codes:      Adline PotterJulia Elizabeth Reinaldo Helt 07/11/2013, 8:42 AM 07/11/2013 Adline PotterJulia Elizabeth Marvette Schamp PTA [phone] pager [phone] office

## 2013-07-11 NOTE — Progress Notes (Signed)
Orthopedics Progress Note  Subjective: Patient feeling better this AM, ready to go home.  Reports that she is passing gas.  Objective:  Filed Vitals:   07/11/13 0559  BP: 119/62  Pulse: 71  Temp: 98 F (36.7 C)  Resp: 18    General: Awake and alert  Musculoskeletal: lumbar incision CDI, dressing changed, no erythema, no drainage Neurovascularly intact  Lab Results  Component Value Date   WBC 6.8 07/02/2013   HGB 12.8 07/02/2013   HCT 38.2 07/02/2013   MCV 85.3 07/02/2013   PLT 233 07/02/2013       Component Value Date/Time   NA 142 07/02/2013 1514   K 3.9 07/02/2013 1514   CL 100 07/02/2013 1514   CO2 29 07/02/2013 1514   GLUCOSE 92 07/02/2013 1514   BUN 12 07/02/2013 1514   CREATININE 0.68 07/02/2013 1514   CALCIUM 9.3 07/02/2013 1514   GFRNONAA >90 07/02/2013 1514   GFRAA >90 07/02/2013 1514    No results found for this basename: INR, PROTIME    Assessment/Plan: POD #2 s/p Procedure(s): POSTERIOR DECOMPRESSION L4-S1 POSTERIOR FUSION L4-S1 WITH CROSSLINK Patient stable for D/C to home.  Meds on chart. Will her bandages to change every other day at home  CarlyssSteven R. Ranell PatrickNorris, MD 07/11/2013 9:45 AM

## 2013-07-11 NOTE — Discharge Instructions (Signed)
Change the bandage as instructed every other day for a week.  Ok to get the wound wet in the shower in a week.  Use brace while up and the walker.  Avoid lifting pushing or pulling or straining.  Follow up with Dr Shon BatonBrooks in two weeks  [phone]

## 2013-07-11 NOTE — Discharge Summary (Signed)
Physician Discharge Summary   Patient ID: [name] MRN: [id number] DOB/AGE: 11-13-1950 63 y.o.  Admit date: 07/09/2013 Discharge date: 07/11/2013  Admission Diagnoses:  Active Problems:   Back pain   Discharge Diagnoses:  Same   Surgeries: Procedure(s): POSTERIOR DECOMPRESSION L4-S1 POSTERIOR FUSION L4-S1 WITH CROSSLINK on 07/09/2013   Consultants: OT, PT  Discharged Condition: Stable  Hospital Course: [name] is an 63 y.o. female who was admitted 07/09/2013 with a chief complaint of back pain, and found to have a diagnosis of lumbar stenosis.  They were brought to the operating room on 07/09/2013 and underwent the above named procedures.    The patient had an uncomplicated hospital course and was stable for discharge.  Recent vital signs:  Filed Vitals:   07/11/13 0559  BP: 119/62  Pulse: 71  Temp: 98 F (36.7 C)  Resp: 18    Recent laboratory studies:  Results for orders placed during the hospital encounter of 07/02/13  SURGICAL PCR SCREEN      Result Value Ref Range   MRSA, PCR NEGATIVE  NEGATIVE   Staphylococcus aureus POSITIVE (*) NEGATIVE  BASIC METABOLIC PANEL      Result Value Ref Range   Sodium 142  137 - 147 mEq/L   Potassium 3.9  3.7 - 5.3 mEq/L   Chloride 100  96 - 112 mEq/L   CO2 29  19 - 32 mEq/L   Glucose, Bld 92  70 - 99 mg/dL   BUN 12  6 - 23 mg/dL   Creatinine, Ser 7.840.68  0.50 - 1.10 mg/dL   Calcium 9.3  8.4 - 69.610.5 mg/dL   GFR calc non Af Amer >90  >90 mL/min   GFR calc Af Amer >90  >90 mL/min  CBC      Result Value Ref Range   WBC 6.8  4.0 - 10.5 K/uL   RBC 4.48  3.87 - 5.11 MIL/uL   Hemoglobin 12.8  12.0 - 15.0 g/dL   HCT 29.538.2  28.436.0 - 13.246.0 %   MCV 85.3  78.0 - 100.0 fL   MCH 28.6  26.0 - 34.0 pg   MCHC 33.5  30.0 - 36.0 g/dL   RDW 44.013.5  10.211.5 - 72.515.5 %   Platelets 233  150 - 400 K/uL  TYPE AND SCREEN      Result Value Ref Range   ABO/RH(D) O POS     Antibody Screen NEG     Sample Expiration 07/16/2013    ABO/RH        Result Value Ref Range   ABO/RH(D) O POS      Discharge Medications:     Medication List    STOP taking these medications       ibuprofen 200 MG tablet  Commonly known as:  ADVIL,MOTRIN      TAKE these medications       ALAWAY 0.025 % ophthalmic solution  Generic drug:  ketotifen  Place 1 drop into both eyes 2 (two) times daily as needed (allergies).     docusate sodium 100 MG capsule  Commonly known as:  COLACE  Take 1 capsule (100 mg total) by mouth 2 (two) times daily.     IMITREX PO  Take 1 tablet by mouth daily as needed (migraine).     loratadine 10 MG tablet  Commonly known as:  CLARITIN  Take 10 mg by mouth daily.     methocarbamol 500 MG tablet  Commonly known as:  ROBAXIN  Take 1 tablet (500 mg total) by mouth every 6 (six) hours as needed for muscle spasms.     ondansetron 4 MG disintegrating tablet  Commonly known as:  ZOFRAN ODT  Take 1 tablet (4 mg total) by mouth every 8 (eight) hours as needed for nausea or vomiting.     oxyCODONE-acetaminophen 10-325 MG per tablet  Commonly known as:  PERCOCET  Take 1-2 tablets by mouth every 6 (six) hours as needed for pain.     polyethylene glycol packet  Commonly known as:  MIRALAX / GLYCOLAX  Take 17 g by mouth daily.        Diagnostic Studies: Dg Lumbar Spine 2-3 Views  07/10/2013   CLINICAL DATA:  s/p spine fusion  EXAM: LUMBAR SPINE - 2-3 VIEW  COMPARISON:  DG C-ARM GT 120 MIN dated 07/09/2013; DG LUMBAR SPINE 2-3 VIEWS dated 07/02/2013  FINDINGS: Patient is status post posterior fusion L4 through S1. Hardware intact without evidence of loosening or failure. Degenerative disc disease changes and grade 2 anterolisthesis at L5-S1 stable. Bilateral pars defects appreciated at L5. No acute osseous abnormalities.  IMPRESSION: Patient is status post fusion L4 through S1.   Electronically Signed   By: Salome HolmesHector  Cooper M.D.   On: 07/10/2013 08:52   Dg Lumbar Spine 2-3 Views  07/02/2013   CLINICAL DATA:  Lumbar  spondylolisthesis.  EXAM: LUMBAR SPINE - 2-3 VIEW  COMPARISON:  MRI dated 08/23/2009 and radiographs dated 07/26/2009  FINDINGS: There is a grade 2 spondylolisthesis of L5 on S1 with bilateral pars defects. Severe narrowing of the L5-S1 disc space. The rest of the lumbar spine appears normal.  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   Electronically Signed   By: Geanie CooleyJim  Maxwell M.D.   On: 07/02/2013 15:38   Dg Lumbar Spine Complete  07/09/2013   CLINICAL DATA:  L4-S1 and paraspinal fusion  EXAM: DG C-ARM GT 120 MIN; LUMBAR SPINE - COMPLETE 4+ VIEW  FLUOROSCOPY TIME:  1 min, 11 seconds  COMPARISON:  DG LUMBAR SPINE 2-3 VIEWS dated 07/02/2013  FINDINGS: Four intraoperative spot radiographic images of the lower lumbar spine are provided for review.  Lumbar spinal labeling is in keeping with preprocedural lumbar spine radiographs.  Images demonstrate the sequela of L4 - S1 paraspinal fusion with pedicular screws seen bilaterally at L4 and S1. There is a suspected laminectomy defect of L5.  There is persistent grade 2 anterolisthesis of L5 upon S1, unchanged. No radiopaque foreign body.  IMPRESSION: Post L4-S1 paraspinal fusion without evidence of complication.   Electronically Signed   By: Simonne ComeJohn  Watts M.D.   On: 07/09/2013 14:22   Dg C-arm Gt 120 Min  07/09/2013   CLINICAL DATA:  L4-S1 and paraspinal fusion  EXAM: DG C-ARM GT 120 MIN; LUMBAR SPINE - COMPLETE 4+ VIEW  FLUOROSCOPY TIME:  1 min, 11 seconds  COMPARISON:  DG LUMBAR SPINE 2-3 VIEWS dated 07/02/2013  FINDINGS: Four intraoperative spot radiographic images of the lower lumbar spine are provided for review.  Lumbar spinal labeling is in keeping with preprocedural lumbar spine radiographs.  Images demonstrate the sequela of L4 - S1 paraspinal fusion with pedicular screws seen bilaterally at L4 and S1. There is a suspected laminectomy defect of L5.  There is persistent grade 2 anterolisthesis of L5 upon S1, unchanged. No  radiopaque foreign body.  IMPRESSION: Post L4-S1 paraspinal fusion without evidence of complication.   Electronically Signed   By: Holland CommonsJohn  Watts M.D.  On: 07/09/2013 14:22    Disposition: home      Discharge Orders   Future Orders Complete By Expires   Call MD / Call 911  As directed    Constipation Prevention  As directed    Diet - low sodium heart healthy  As directed    Discharge instructions  As directed    Driving restrictions  As directed    Increase activity slowly as tolerated  As directed    Lifting restrictions  As directed       Follow-up Information   Schedule an appointment as soon as possible for a visit with Alvy Beal, MD. (need return office visit 2 weeks postop)    Specialty:  Orthopedic Surgery   Contact information:   [address] [place] [address] [phone]       Follow up with [facility]. (Someone from [facility] will contact you concerning start date and time for physical therapist.)    Contact information:   [address] [place] [address] [phone]        Signed: Verlee Rossetti 07/11/2013, 9:48 AM

## 2015-09-21 IMAGING — CR DG LUMBAR SPINE 2-3V
2 series · 2 of 2 positions shown · non-contrast
Comparison: MRI dated 08/23/2009 and radiographs dated 07/26/2009

CLINICAL DATA: Lumbar spondylolisthesis.

EXAM:
LUMBAR SPINE - 2-3 VIEW

[t lumbar spine ap]
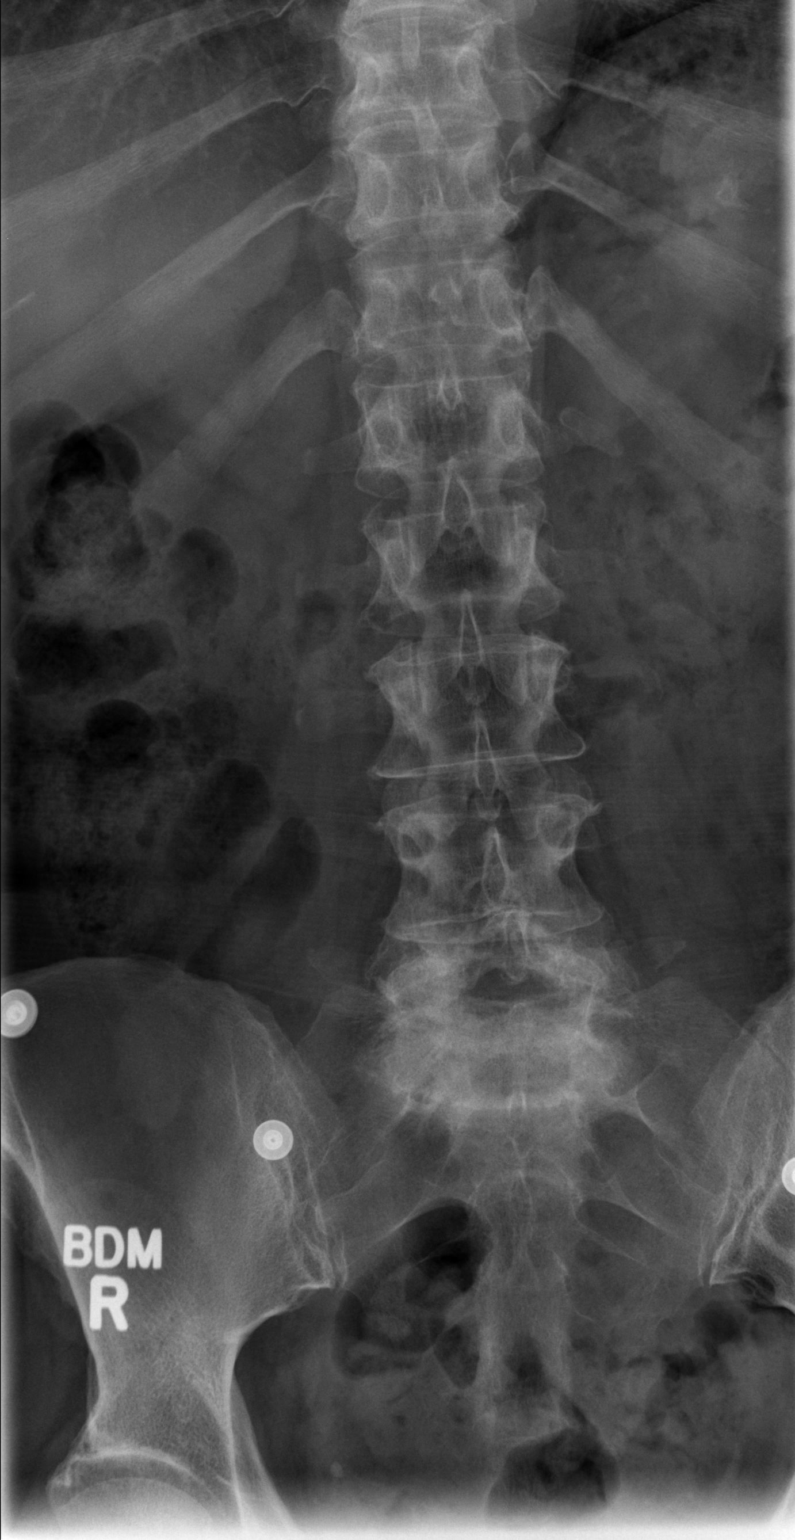

[t lumbar spine lat]
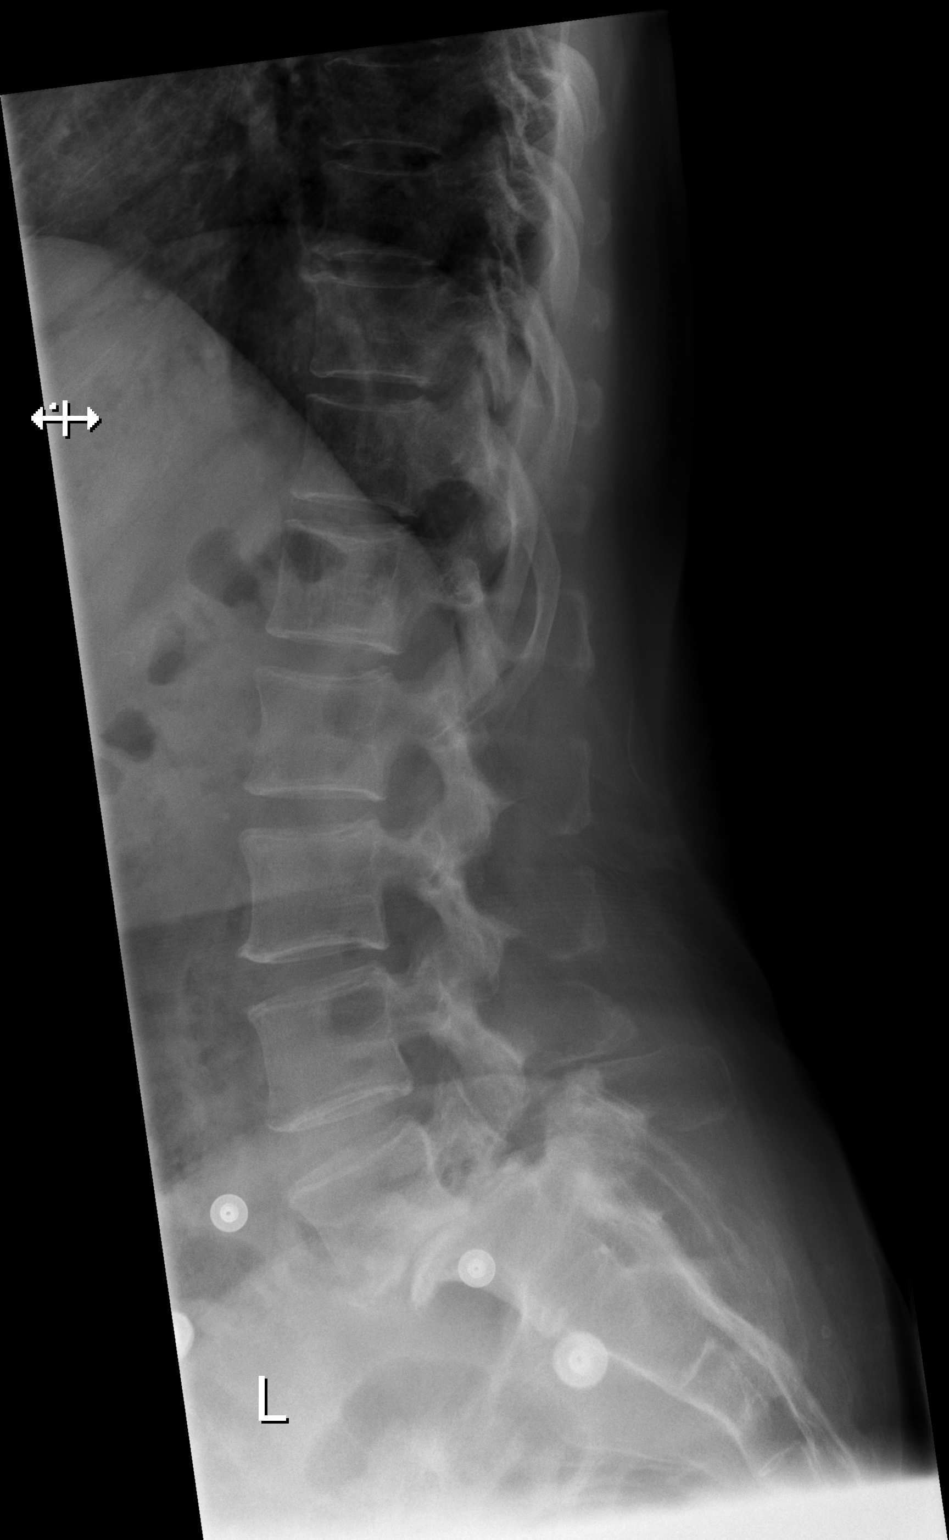

[2 of 2 positions shown; findings below may reference images not displayed]

FINDINGS: There is a grade 2 spondylolisthesis of L5 on S1 with bilateral pars
defects. Severe narrowing of the L5-S1 disc space. The rest of the
lumbar spine appears normal.
IMPRESSION: Grade 2 spondylolisthesis and bilateral spondylolysis at L5 on S1.
